# Patient Record
Sex: Male | Born: 1990 | Race: White | Hispanic: No | Marital: Married | Smoking: Never smoker
Health system: Southern US, Academic
[De-identification: ages and names within clinical notes are randomized; demographics above are authoritative.]

## PROBLEM LIST (undated history)

## (undated) DIAGNOSIS — I10 Essential (primary) hypertension: Secondary | ICD-10-CM

## (undated) HISTORY — DX: Essential (primary) hypertension: I10

## (undated) HISTORY — PX: HX TONSILLECTOMY: SHX27

## (undated) NOTE — Progress Notes (Signed)
Formatting of this note is different from the original.  Images from the original note were not included.    [address]  [place] [address]  [phone]    First Visit    Subjective     Patient ID:  Stephen Walter is a 77 y.o. (DOB [birth year]) male.     CC:     Patient presents with    Gastroesophageal Reflux       HPI: 71 year old male presents today for an initial evaluation for symptoms of GERD.  The patient says he has had GERD symptoms since he was young.  He has been on omeprazole 20 mg daily but does have occasional breakthrough.  He says this does control his GERD symptoms.  He says he had an endoscopy in 2019 and I have reviewed these records.  He was found to have gastritis and a small hiatal hernia.  He also says when he was 16 he was found to have Ulcerative colitis and he was started on medication but he only took it for a short period of time.  He says he was hospitalized at that time and was told it was either enterocolitis due to raw cookie dough he had eaten vs Ulcerative colitis. He has not had any rectal bleeding since then.  He says that he is now having 1-2 formed to soft bowel movements a day and says it is associated with a sense of complete evacuation.  No dysphagia or odynophagia. No melena or hematemesis. His father has colon cancer on his maternal side. No urgent stools.  No family history of Ulcerative colitis or Crohn's disease. He has lost about 20 pounds in the past 9 months which has been intentional.     Review of Systems:   Except as stated in the HPI, all other systems reviewed and are negative.     History reviewed. No pertinent past medical history.    There are no problems to display for this patient.    Medications:  Outpatient Medications Marked as Taking for the 09/19/22 encounter (Office Visit) with Erskine Emery, MD   Medication Sig Dispense Refill    lisinopril (PRINIVIL,ZESTRIL) 20 mg tablet Take one tablet (20 mg dose) by mouth daily.      omeprazole  (PRILOSEC) 20 mg capsule Take 1 capsule (20 mg) by mouth daily.         Not on File     Past Surgical History:   Procedure Laterality Date    Colonoscopy      unsure    Upper gastrointestinal endoscopy      unsure     Social History     Socioeconomic History    Marital status: Married   Tobacco Use    Smoking status: Never     Passive exposure: Never    Smokeless tobacco: Never   Vaping Use    Vaping Use: Never used   Substance and Sexual Activity    Drug use: Yes     History reviewed. No pertinent family history.    Objective     BP 140/90 (BP Location: Left arm, Patient Position: Sitting)   Pulse 79   Temp 97.7 F (36.5 C) (Oral)   Ht 5\' 10"  (1.778 m)   Wt 224 lb (101.6 kg)   BMI 32.14 kg/m      General Appearance:  Alert, cooperative, no distress, appears stated age   Head:  Normocephalic, without obvious abnormality, atraumatic  Eyes:  PERRL, conjunctiva/corneas clear   Nose: Nares normal, mucosa normal   Throat: Lips, mucosa, and tongue normal; teeth and gums normal   Neck: Supple, symmetrical, trachea midline, no adenopathy, thyroid without tenderness/mass/nodules   Lungs:   Clear to auscultation bilaterally, respirations unlabored, excursion symmetrical   Heart:  Regular rate and rhythm, S1, S2 normal, no murmur, rub or gallop   Abdomen:   Soft, non-tender, bowel sounds normoactive,  no masses, no organomegaly   Genitalia:  Deferred   Rectal:  Deferred   Extremities: Extremities normal, atraumatic, no cyanosis or edema, pulses 2+ symmetrically   Skin: Skin color, texture, turgor normal, no rashes or lesions   Lymph nodes: No cervical, supraclavicular, and axillary adenopathy   Neurologic: Alert, oriented x3, nonfocal exam       Assessment     1. Gastroesophageal reflux disease, unspecified whether esophagitis present    2. Colitis, acute        Plan     Orders Placed This Encounter   Procedures    C-Reactive Protein    EGD Diagnostic     No follow-ups on file.  There are no Patient Instructions on  file for this visit.  Discontinued Medications    No medications on file     Modified Medications    No medications on file     New Prescriptions    FAMOTIDINE (PEPCID) 20 MG TABLET    Take one tablet (20 mg dose) by mouth 30 (thirty) minutes before breakfast.     Discussion and Summary:  GERD-the patient has a longstanding history of GERD and is taking omeprazole 20 mg daily currently.  He would like to be able to come off of this medication however he does still feel regurgitation although he does not feel acid.  I do believe he would benefit from an endoscopy which I will schedule in the meanwhile we will try to switch him over to Pepcid 20 mg daily.    Colitis-the patient has a history of colitis and was told that he may have ulcerative colitis in the past.  He was only on treatment for short period of time and never had a recurrence of his symptoms and I suspect that likely that was related to an infectious colitis and he is not having any symptoms now so I do not see any need to repeat a colonoscopy at this point.  I will go ahead and check a CRP.    Electronically Signed:  Erskine Emery, MD  09/19/2022 9:02 AM        Electronically signed by Erskine Emery, MD at 09/19/2022  9:02 AM EDT

## (undated) NOTE — Progress Notes (Signed)
Formatting of this note might be different from the original.  EGD prep was given to the patient  Patient verbalizes and understands prep instructions  AVS was given to the patient. ERW,CMA   Electronically signed by Rory Percy, CMA at 09/19/2022  9:02 AM EDT

---
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
## 2021-05-08 ENCOUNTER — Encounter (INDEPENDENT_AMBULATORY_CARE_PROVIDER_SITE_OTHER): Payer: Self-pay | Admitting: Family Medicine

## 2021-06-06 ENCOUNTER — Ambulatory Visit (INDEPENDENT_AMBULATORY_CARE_PROVIDER_SITE_OTHER): Payer: BC Managed Care – PPO | Admitting: Family Medicine

## 2021-06-06 ENCOUNTER — Other Ambulatory Visit: Payer: Self-pay

## 2021-06-06 ENCOUNTER — Other Ambulatory Visit (INDEPENDENT_AMBULATORY_CARE_PROVIDER_SITE_OTHER): Payer: Self-pay | Admitting: Family Medicine

## 2021-06-06 ENCOUNTER — Encounter (INDEPENDENT_AMBULATORY_CARE_PROVIDER_SITE_OTHER): Payer: Self-pay | Admitting: Family Medicine

## 2021-06-06 VITALS — BP 153/92 | HR 104 | Temp 97.7°F | Resp 20 | Ht 70.0 in | Wt 221.8 lb

## 2021-06-06 DIAGNOSIS — J0100 Acute maxillary sinusitis, unspecified: Secondary | ICD-10-CM

## 2021-06-06 DIAGNOSIS — K219 Gastro-esophageal reflux disease without esophagitis: Secondary | ICD-10-CM

## 2021-06-06 DIAGNOSIS — I10 Essential (primary) hypertension: Secondary | ICD-10-CM

## 2021-06-06 DIAGNOSIS — E559 Vitamin D deficiency, unspecified: Secondary | ICD-10-CM

## 2021-06-06 DIAGNOSIS — J0101 Acute recurrent maxillary sinusitis: Secondary | ICD-10-CM

## 2021-06-06 MED ORDER — AMOXICILLIN 500 MG CAPSULE
500.0000 mg | ORAL_CAPSULE | Freq: Two times a day (BID) | ORAL | 0 refills | Status: AC
Start: 2021-06-06 — End: 2021-06-16

## 2021-06-06 MED ORDER — METHYLPREDNISOLONE ACETATE 80 MG/ML SUSPENSION FOR INJECTION
80.0000 mg | INTRAMUSCULAR | Status: AC
Start: 2021-06-06 — End: 2021-06-06
  Administered 2021-06-06: 80 mg via INTRAMUSCULAR

## 2021-06-06 MED ORDER — OMEPRAZOLE 20 MG CAPSULE,DELAYED RELEASE
20.0000 mg | DELAYED_RELEASE_CAPSULE | Freq: Every day | ORAL | 1 refills | Status: DC
Start: 2021-06-06 — End: 2021-11-08

## 2021-06-06 NOTE — Progress Notes (Signed)
Name: Stephen Walter   DOB: 01-25-1991  [31 y.o. male]   MRN: [id number]       Visit Date: 06/06/2021   Referring: Referral Self  No address on file   PCP: Daiva Huge, DO            Chief Complaint   Patient presents with   . Sinus Infection     Got back from cruise x 2 weeks ago.   Sinus pressure, headache, congestion and drainage on/off x 2 weeks. Home COVID test last week was negative.   States that his 29 year old had a sore throat 2 days after cruise all testing negative.   A dozen people had possible pneumonia, upper respiratory symptoms that had also been on cruise.           HPI:  Patient here with for complaints of sinus pressure, yellow drainage from nose, home covid neg.      Respiratory:   Symptoms: congestion, facial pain, headache described as frontal/maxillary pressure, nasal congestion, non productive cough and sore throat  Duration: 2 weeks days          Review of Systems   Constitutional: Negative.  Negative for chills, fever and weight loss.   HENT: Positive for congestion and sinus pain. Negative for ear pain, hearing loss and sore throat.    Eyes: Negative.  Negative for blurred vision, pain and redness.   Respiratory: Negative.  Negative for cough, shortness of breath and wheezing.    Cardiovascular: Negative.  Negative for chest pain and palpitations.   Gastrointestinal: Negative.  Negative for abdominal pain, blood in stool, constipation, diarrhea, heartburn, nausea and vomiting.   Genitourinary: Negative.  Negative for flank pain, frequency, hematuria and urgency.   Musculoskeletal: Negative.  Negative for back pain, joint pain and neck pain.   Skin: Negative.  Negative for itching and rash.   Neurological: Negative.  Negative for speech change, seizures, weakness and headaches.   Endo/Heme/Allergies: Negative.    Psychiatric/Behavioral: Negative.           Physical Exam  Constitutional:       Appearance: Normal appearance.   HENT:      Head: Normocephalic.      Right Ear: External  ear normal.      Left Ear: External ear normal.   Eyes:      Conjunctiva/sclera: Conjunctivae normal.      Pupils: Pupils are equal, round, and reactive to light.   Cardiovascular:      Rate and Rhythm: Normal rate.      Pulses: Normal pulses.   Pulmonary:      Effort: Pulmonary effort is normal. No respiratory distress.      Breath sounds: Normal breath sounds. No wheezing or rales.   Musculoskeletal:         General: Normal range of motion.   Skin:     General: Skin is warm.   Neurological:      General: No focal deficit present.      Mental Status: He is alert. Mental status is at baseline.   Psychiatric:         Mood and Affect: Mood normal.         Thought Content: Thought content normal.         Judgment: Judgment normal.            Problem List Items Addressed This Visit    None  Visit Diagnoses     Acute  maxillary sinusitis    -  Primary             Plan    Depo medrol given in office  Amoxicillin prescribed  Advised increase fluid intake, getting adequate rest..  Discussed proper hygiene to assist in decrease to spread of illness.    Return to clinic if symptoms persist beyond treatment or change/worsen.

## 2021-06-18 ENCOUNTER — Other Ambulatory Visit (INDEPENDENT_AMBULATORY_CARE_PROVIDER_SITE_OTHER): Payer: Self-pay | Admitting: Family Medicine

## 2021-06-18 MED ORDER — LISINOPRIL 20 MG TABLET
20.0000 mg | ORAL_TABLET | Freq: Every day | ORAL | 1 refills | Status: DC
Start: 2021-06-18 — End: 2021-11-08

## 2021-07-11 ENCOUNTER — Encounter (INDEPENDENT_AMBULATORY_CARE_PROVIDER_SITE_OTHER): Payer: BC Managed Care – PPO | Admitting: Family Medicine

## 2021-07-12 ENCOUNTER — Encounter (INDEPENDENT_AMBULATORY_CARE_PROVIDER_SITE_OTHER): Payer: BC Managed Care – PPO | Admitting: Family Medicine

## 2021-08-14 ENCOUNTER — Other Ambulatory Visit: Payer: Self-pay

## 2021-08-14 ENCOUNTER — Encounter (INDEPENDENT_AMBULATORY_CARE_PROVIDER_SITE_OTHER): Payer: Self-pay | Admitting: Family Medicine

## 2021-08-14 ENCOUNTER — Ambulatory Visit (INDEPENDENT_AMBULATORY_CARE_PROVIDER_SITE_OTHER): Payer: BC Managed Care – PPO | Admitting: Family Medicine

## 2021-08-14 VITALS — BP 137/91 | HR 75 | Temp 98.1°F | Resp 18 | Ht 70.0 in | Wt 220.6 lb

## 2021-08-14 DIAGNOSIS — Z0001 Encounter for general adult medical examination with abnormal findings: Secondary | ICD-10-CM

## 2021-08-14 DIAGNOSIS — Z0000 Encounter for general adult medical examination without abnormal findings: Secondary | ICD-10-CM

## 2021-08-14 DIAGNOSIS — J019 Acute sinusitis, unspecified: Secondary | ICD-10-CM

## 2021-08-14 DIAGNOSIS — I10 Essential (primary) hypertension: Secondary | ICD-10-CM

## 2021-08-14 DIAGNOSIS — E559 Vitamin D deficiency, unspecified: Secondary | ICD-10-CM

## 2021-08-14 MED ORDER — AZITHROMYCIN 250 MG TABLET
ORAL_TABLET | ORAL | 0 refills | Status: DC
Start: 2021-08-14 — End: 2023-10-07

## 2021-08-14 MED ORDER — METHYLPREDNISOLONE ACETATE 80 MG/ML SUSPENSION FOR INJECTION
80.0000 mg | INTRAMUSCULAR | Status: AC
Start: 2021-08-14 — End: 2021-08-14
  Administered 2021-08-14: 80 mg via INTRAMUSCULAR

## 2021-08-14 NOTE — Nursing Note (Signed)
08/14/21 1548   Depression Screen   Little interest or pleasure in doing things. 0   Feeling down, depressed, or hopeless 0   PHQ 2 Total 0

## 2021-08-14 NOTE — Progress Notes (Signed)
[facility]  FAMILY MEDICINE, MEDICAL OFFICE BUILDING  [address]  [place] [address]  [phone]   Stephen Walter  07-30-1990  [id number]    Date of Service: 08/22/2021    Chief complaint:   Chief Complaint   Patient presents with   . Physical       Subjective:      Stephen Walter is a 31 y.o. male and is here for a comprehensive physical exam.  The patient reports congestion, sinus pain, cough for days, aside from this doing well.    Past Medical History:  Past Medical History:   Diagnosis Date   . Hypertension          Past Surgical History:   Procedure Laterality Date   . HX TONSILLECTOMY            Family Medical History:     Problem Relation (Age of Onset)    Cancer Other    Hypertension (High Blood Pressure) Other                Medications:  Current Outpatient Medications   Medication Sig Dispense Refill   . azithromycin (ZITHROMAX) 250 mg Oral Tablet Take 500 mg (2 tab) on day 1; take 250 mg (1 tab) on days 2-5. 6 Tablet 0   . ergocalciferol, vitamin D2, (DRISDOL) 1,250 mcg (50,000 unit) Oral Capsule Take 1 Capsule (50,000 Units total) by mouth Every 7 days     . lisinopriL (PRINIVIL) 20 mg Oral Tablet Take 1 Tablet (20 mg total) by mouth Once a day 90 Tablet 1   . omeprazole (PRILOSEC) 20 mg Oral Capsule, Delayed Release(E.C.) Take 1 Capsule (20 mg total) by mouth Once a day 90 Capsule 1     No current facility-administered medications for this visit.     Do you take any herbs or supplements that were not prescribed by a doctor? no  Are you taking calcium supplements? no  Are you taking aspirin daily?no    No Known Allergies    Social History     Socioeconomic History   . Marital status: Unknown     Spouse name: Not on file   . Number of children: Not on file   . Years of education: Not on file   . Highest education level: Not on file   Occupational History   . Not on file   Tobacco Use   . Smoking status: Never   . Smokeless tobacco: Never   Substance and Sexual  Activity   . Alcohol use: Not Currently   . Drug use: Never   . Sexual activity: Not on file   Other Topics Concern   . Not on file   Social History Narrative   . Not on file     Social Determinants of Health     Financial Resource Strain: Not on file   Transportation Needs: Not on file   Social Connections: Not on file   Intimate Partner Violence: Not on file   Housing Stability: Not on file       Review of Systems    Review of Systems   Constitutional: Negative.  Negative for chills, fever and weight loss.   HENT: Negative.  Negative for congestion, ear pain, hearing loss, sinus pain and sore throat.    Eyes: Negative.  Negative for blurred vision, pain and redness.   Respiratory: Negative.  Negative for cough, shortness of breath and wheezing.  Cardiovascular: Negative.  Negative for chest pain and palpitations.   Gastrointestinal: Negative.  Negative for abdominal pain, blood in stool, constipation, diarrhea, heartburn, nausea and vomiting.   Genitourinary: Negative.  Negative for flank pain, frequency, hematuria and urgency.   Musculoskeletal: Negative.  Negative for back pain, joint pain and neck pain.   Skin: Negative.  Negative for itching and rash.   Neurological: Negative.  Negative for speech change, seizures, weakness and headaches.   Endo/Heme/Allergies: Negative.    Psychiatric/Behavioral: Negative.           Objective:     BP (!) 137/91   Pulse 75   Temp 36.7 C (98.1 F)   Resp 18   Ht 1.778 m (5\' 10" )   Wt 100 kg (220 lb 9.6 oz)   SpO2 98%   BMI 31.65 kg/m         General:  alert, cooperative, no distress, appears stated age   Skin:  Normal.   Eyes: conjunctivae/corneas clear. PERRL, EOM's intact. Fundi benign   Mouth: MMM no lesions   Lymph Nodes:  Cervical, supraclavicular, and axillary nodes normal.   Lungs:  clear to auscultation bilaterally   Heart:  regular rate and rhythm, S1, S2 normal, no murmur, click, rub or gallop   Abdomen: soft, non-tender. Bowel sounds normal. No masses,  no  organomegaly               Extremities:  extremities normal, atraumatic, no cyanosis or edema   Neurologic:  AOx3. Gait normal. Reflexes and motor strength normal and symmetric. Cranial nerves 2-12 and sensation grossly intact.   Psychiatric:  non focal   Laboratory:     Assessment:       ICD-10-CM    1. Vitamin D deficiency  E55.9 LIPID PANEL     VITAMIN D 25 TOTAL     GLUCOSE, NON FASTING      2. Hypertension, unspecified type  I10 LIPID PANEL     VITAMIN D 25 TOTAL     GLUCOSE, NON FASTING      3. Well adult exam  Z00.00 LIPID PANEL     VITAMIN D 25 TOTAL     GLUCOSE, NON FASTING          Plan:     PLAN:  Counseling with regards to preventative care given  Patient given Advanced Directive if applicable? not applicable  Immunizations reviewed         Patient Counseling:  --Nutrition: advise patient to eat a diet that is primarily lean protein and produce (fruits/vegetables), drink primarily water and low/no calorie drinks, moderation on caffeine/sugary/greasy foods  --Exercise: encourage 150 mins of exercise per week  --Substance Abuse: Discussed cessation/primary prevention of tobacco, alcohol, or other drug use; driving or other dangerous activities under the influence; availability of treatment for abuse.    --Sexuality: Discussed sexually transmitted diseases, partner selection, use of condoms, avoidance of unintended pregnancy  and contraceptive alternatives.   --Injury prevention: Discussed safety belts, safety helmets, smoke detector, smoking near bedding or upholstery.   --Dental health: Discussed importance of regular tooth brushing, flossing, and dental visits.        Plan:  Orders Placed This Encounter   . LIPID PANEL   . VITAMIN D 25 TOTAL   . GLUCOSE, NON FASTING   . azithromycin (ZITHROMAX) 250 mg Oral Tablet   . methylPREDNISolone acetate (DEPO-MEDROL) 80 mg/mL injection     zpack and depo medrol for sinusitis   labs today  will sign form for insurance once these return              Return in about 1  year (around 08/15/2022) for well adult.    Cleda Clarks, DO 08/22/2021, 08:13

## 2021-08-22 ENCOUNTER — Encounter (INDEPENDENT_AMBULATORY_CARE_PROVIDER_SITE_OTHER): Payer: Self-pay | Admitting: Family Medicine

## 2021-08-23 ENCOUNTER — Other Ambulatory Visit: Payer: Self-pay

## 2021-08-23 ENCOUNTER — Other Ambulatory Visit: Payer: BC Managed Care – PPO | Attending: Family Medicine | Admitting: Family Medicine

## 2021-08-23 ENCOUNTER — Ambulatory Visit (INDEPENDENT_AMBULATORY_CARE_PROVIDER_SITE_OTHER): Payer: BC Managed Care – PPO

## 2021-08-23 VITALS — BP 126/76 | HR 82

## 2021-08-23 DIAGNOSIS — I10 Essential (primary) hypertension: Secondary | ICD-10-CM

## 2021-08-23 DIAGNOSIS — E559 Vitamin D deficiency, unspecified: Secondary | ICD-10-CM | POA: Insufficient documentation

## 2021-08-23 DIAGNOSIS — Z0000 Encounter for general adult medical examination without abnormal findings: Secondary | ICD-10-CM

## 2021-08-23 LAB — LIPID PANEL
CHOL/HDL RATIO: 3.1
CHOLESTEROL: 171 mg/dL (ref <200)
HDL CHOL: 55 mg/dL (ref 23–92)
LDL CALC: 62 mg/dL (ref 0–100)
TRIGLYCERIDES: 271 mg/dL — ABNORMAL HIGH (ref <=150)
VLDL CALC: 54 mg/dL — ABNORMAL HIGH (ref 0–50)

## 2021-08-23 LAB — GLUCOSE, NON FASTING: GLUCOSE: 89 mg/dL (ref 74–109)

## 2021-08-23 LAB — VITAMIN D 25 TOTAL: VITAMIN D: 25 ng/mL — ABNORMAL LOW (ref 30–100)

## 2021-08-23 NOTE — Progress Notes (Signed)
Patient here for BP recheck. Today it is 126/76.

## 2021-11-01 ENCOUNTER — Other Ambulatory Visit (INDEPENDENT_AMBULATORY_CARE_PROVIDER_SITE_OTHER): Payer: Self-pay | Admitting: Family Medicine

## 2021-11-08 ENCOUNTER — Other Ambulatory Visit (INDEPENDENT_AMBULATORY_CARE_PROVIDER_SITE_OTHER): Payer: Self-pay | Admitting: Family Medicine

## 2022-10-17 ENCOUNTER — Other Ambulatory Visit (HOSPITAL_COMMUNITY): Payer: Self-pay | Admitting: Family Medicine

## 2022-10-17 ENCOUNTER — Other Ambulatory Visit: Payer: Self-pay

## 2022-10-17 ENCOUNTER — Inpatient Hospital Stay
Admission: RE | Admit: 2022-10-17 | Discharge: 2022-10-17 | Disposition: A | Discharge status: Home / Self Care | Payer: BC Managed Care – PPO | Source: Ambulatory Visit | Attending: Family Medicine | Admitting: Family Medicine

## 2022-10-17 DIAGNOSIS — R059 Cough, unspecified: Secondary | ICD-10-CM

## 2022-11-22 ENCOUNTER — Ambulatory Visit
Admission: RE | Admit: 2022-11-22 | Discharge: 2022-11-22 | Disposition: A | Discharge status: Home / Self Care | Payer: BC Managed Care – PPO | Source: Ambulatory Visit | Attending: Family Medicine | Admitting: Family Medicine

## 2022-11-22 ENCOUNTER — Other Ambulatory Visit (HOSPITAL_COMMUNITY): Payer: Self-pay | Admitting: Family Medicine

## 2022-11-22 ENCOUNTER — Other Ambulatory Visit: Payer: Self-pay

## 2022-11-22 DIAGNOSIS — M25531 Pain in right wrist: Secondary | ICD-10-CM

## 2023-01-22 ENCOUNTER — Other Ambulatory Visit (HOSPITAL_COMMUNITY): Payer: Self-pay | Admitting: Family Medicine

## 2023-01-22 DIAGNOSIS — M25531 Pain in right wrist: Secondary | ICD-10-CM

## 2023-01-23 ENCOUNTER — Encounter (HOSPITAL_COMMUNITY): Payer: Self-pay

## 2023-01-31 IMAGING — MR MRI WRIST RT W/O CONTRAST
5 of 7 series · 29 of 40 positions shown · IV contrast (gadolinium)
Comparison: None available.

﻿EXAM:  46446   MRI WRIST RT W/O CONTRAST
INDICATION: Right wrist pain on the ulnar aspect.  Sustained injury due to fall.  No history of right wrist surgery.
TECHNIQUE: Multiplanar, multisequential MRI of the right wrist was performed without gadolinium contrast.

[Series 6: T1 · axial · right · 3.0mm · 0.26mm/px · z∈[-70,-16]mm · 7 of 20 slices shown (1 of 2)]
[im 1/20]
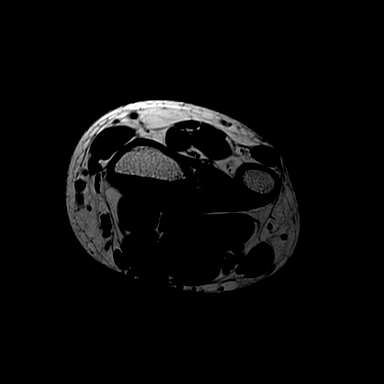
[im 4/20]
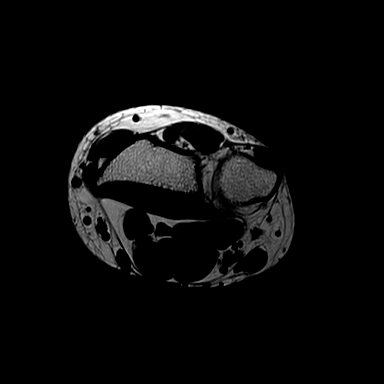
[im 7/20]
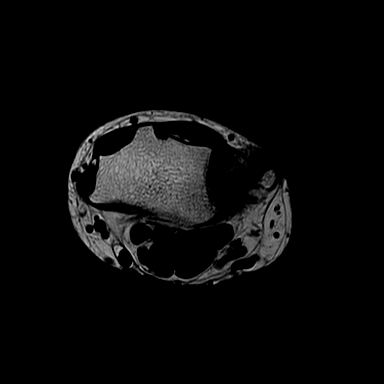
[im 10/20]
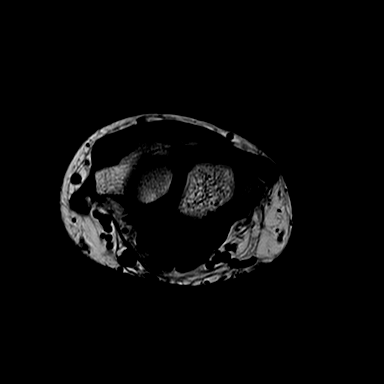
[im 13/20]
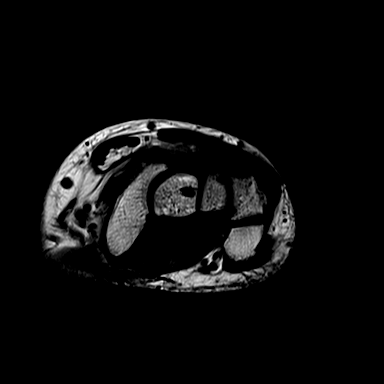
[im 16/20]
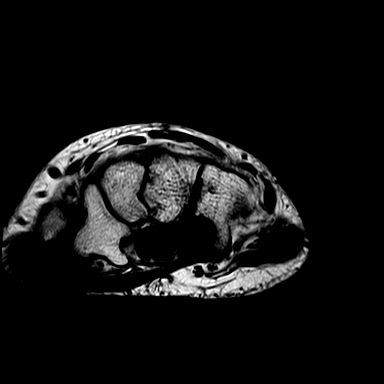
[im 20/20]
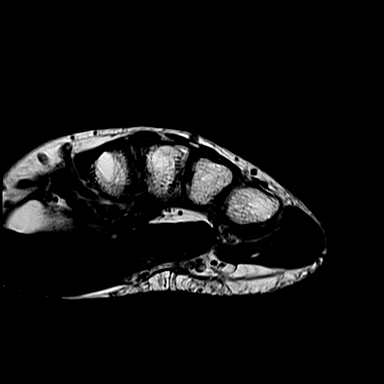

[Series 7: PD fat-sat · axial · right · 3.0mm · 0.22mm/px · z∈[-70,-16]mm · 7 of 20 slices shown]
[im 1/20]
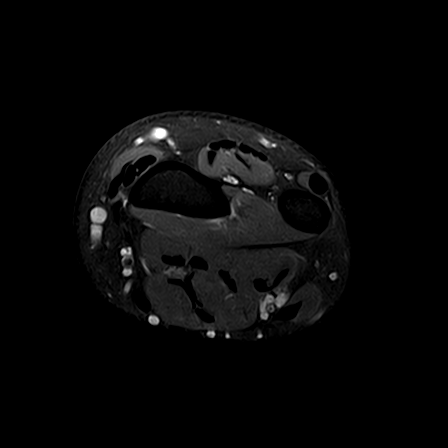
[im 4/20]
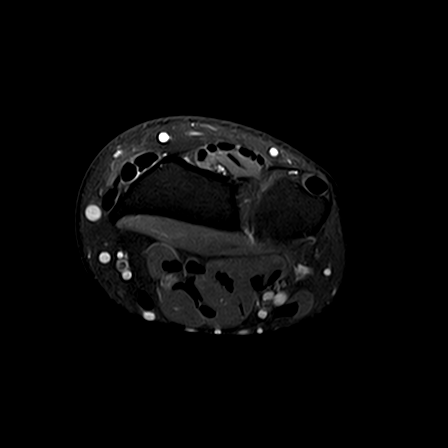
[im 7/20]
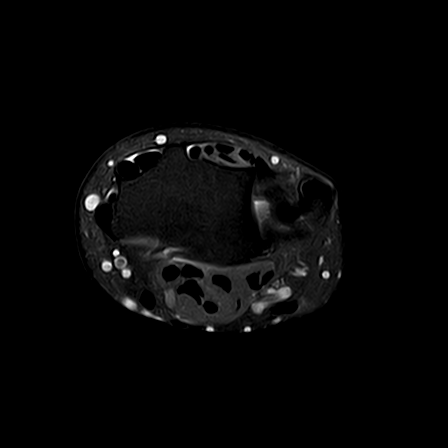
[im 10/20]
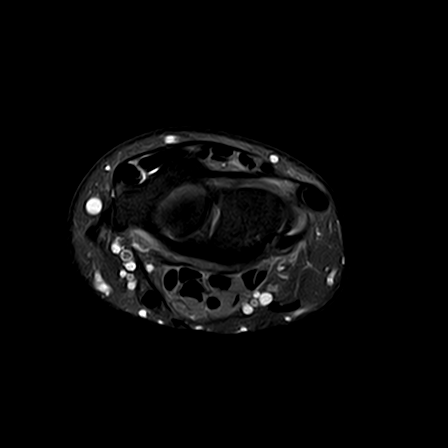
[im 13/20]
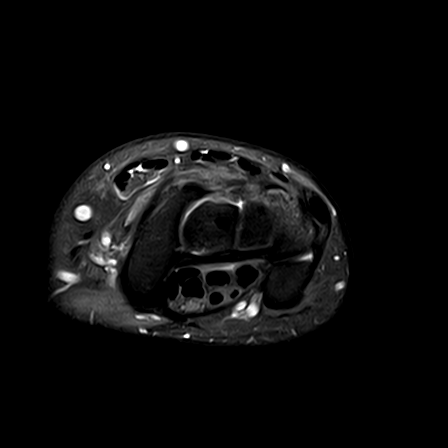
[im 16/20]
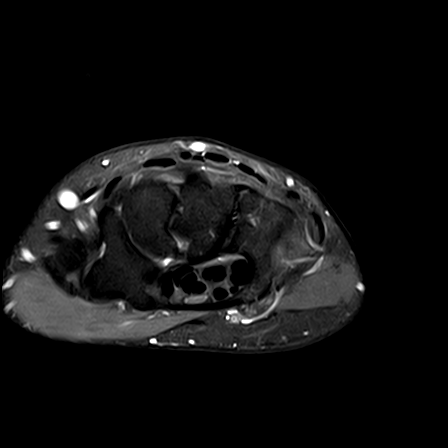
[im 20/20]
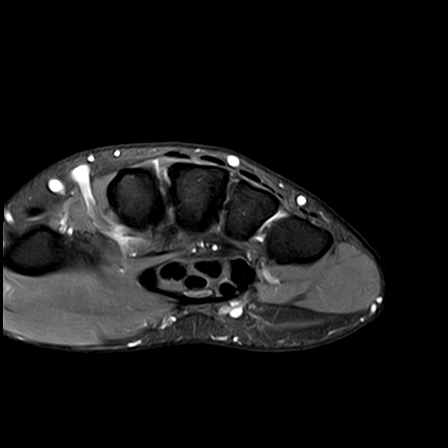

[Series 8: T1 · sagittal · right · 3.5mm · 0.22mm/px · 3 of 20 slices shown (2 of 2)]
[im 1/20]
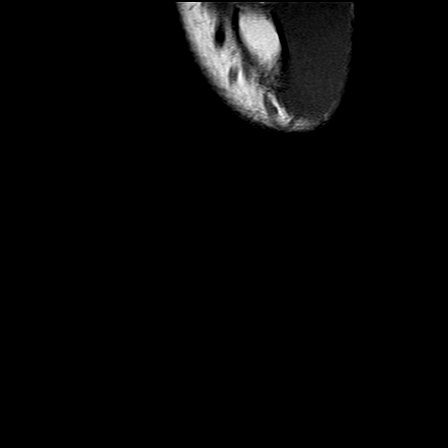
[im 4/20]
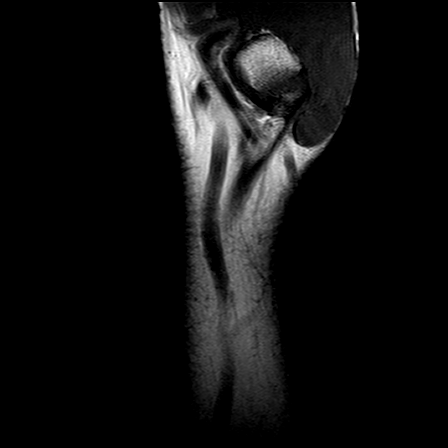
[im 7/20]
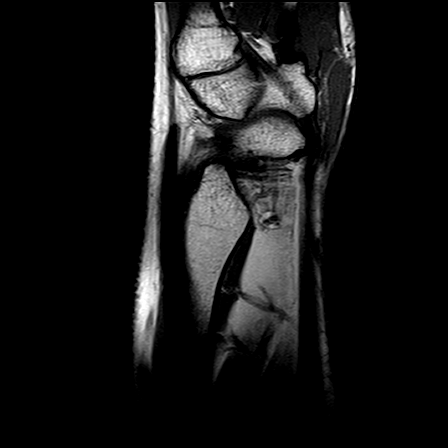

[Series 9: T2 fat-sat · sagittal · right · 3.5mm · 0.22mm/px · 7 of 20 slices shown]
[im 1/20]
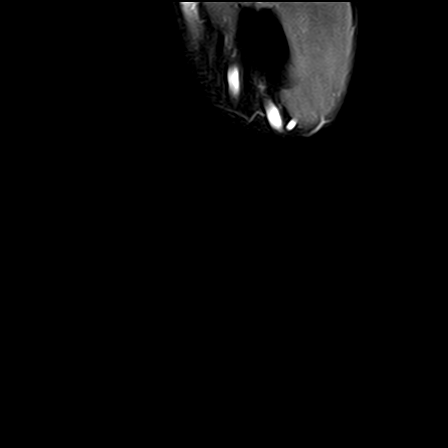
[im 4/20]
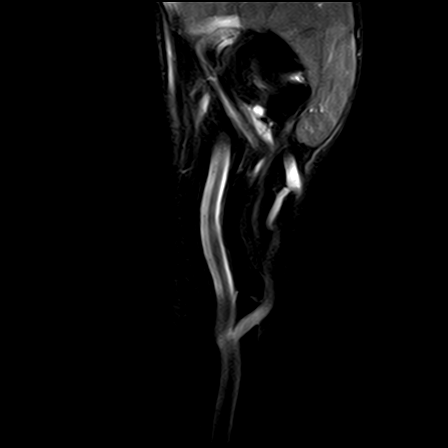
[im 7/20]
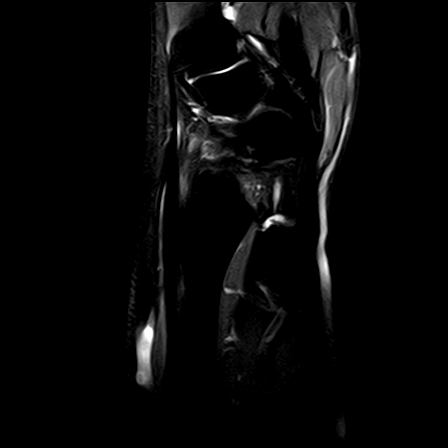
[im 10/20]
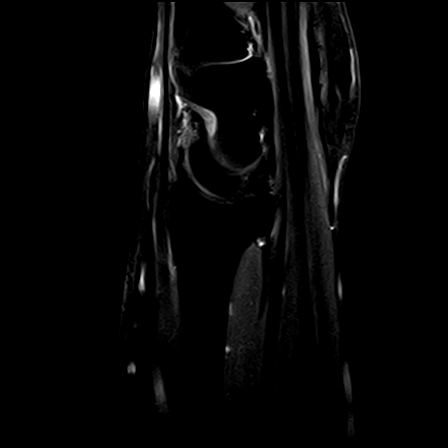
[im 13/20]
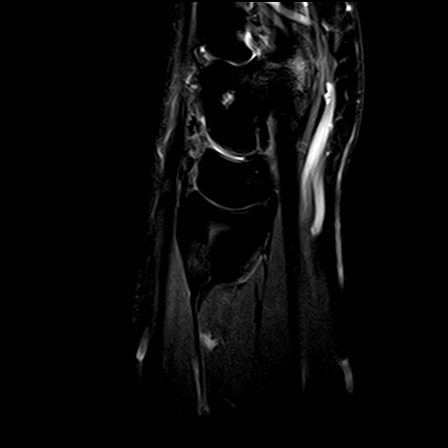
[im 16/20]
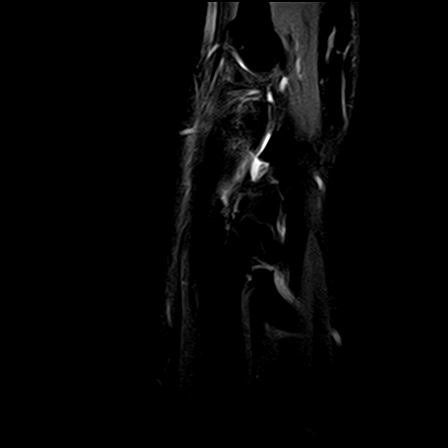
[im 20/20]
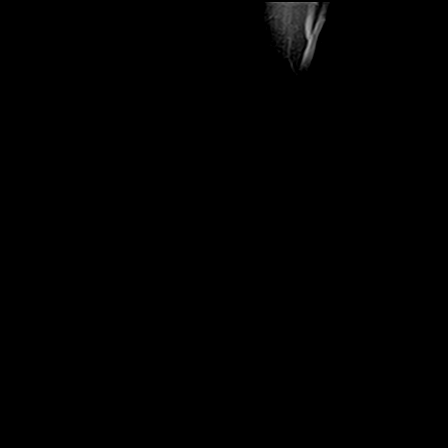

[Series 13: PD · coronal · right · 3.0mm · 0.29mm/px · 5 of 14 slices shown]
[im 1/14]
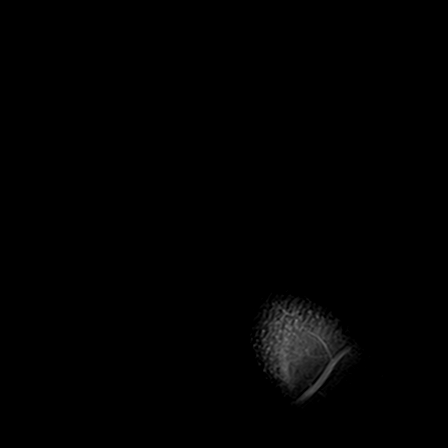
[im 4/14]
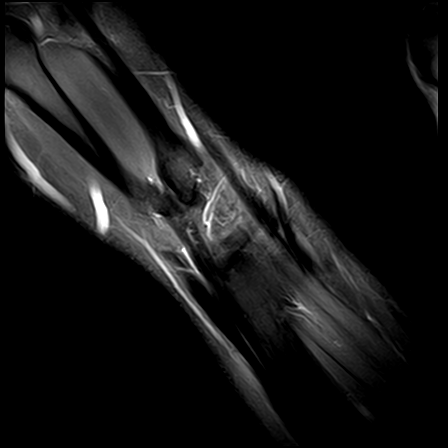
[im 7/14]
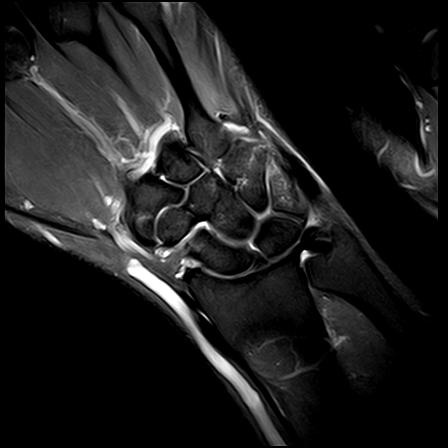
[im 10/14]
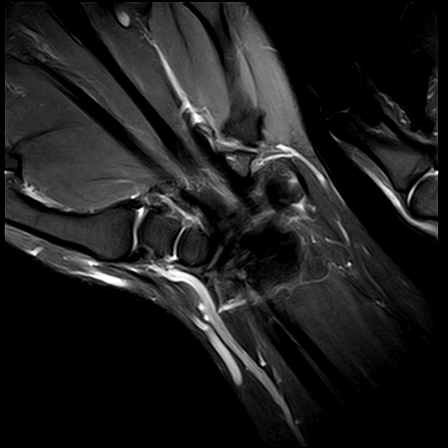
[im 14/14]
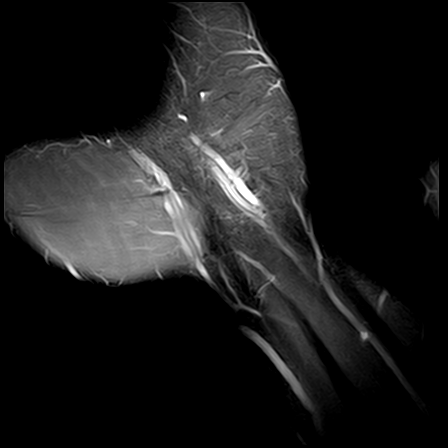

[29 of 40 positions shown; findings below may reference images not displayed]

FINDINGS: No acute or focal bone changes are seen at the right wrist.  Distal radioulnar ligaments are intact. 

Intercarpal ligaments are intact.  Triangular fibrocartilage is intact.

Structures of carpal tunnel are intact. Extensor tendons are unremarkable.  Soft tissue structures are unremarkable.
IMPRESSION: No significant acute or focal bone changes at the wrist.  Intercarpal ligaments and triangular fibrocartilage are intact.  No definite focal abnormalities of the tendons of the wrist are seen.

## 2023-10-06 ENCOUNTER — Other Ambulatory Visit: Payer: Self-pay

## 2023-10-07 ENCOUNTER — Ambulatory Visit: Payer: Self-pay | Attending: Family Medicine | Admitting: OTOLARYNGOLOGY

## 2023-10-07 ENCOUNTER — Encounter (INDEPENDENT_AMBULATORY_CARE_PROVIDER_SITE_OTHER): Payer: Self-pay | Admitting: OTOLARYNGOLOGY

## 2023-10-07 VITALS — Ht 70.0 in | Wt 220.0 lb

## 2023-10-07 DIAGNOSIS — J309 Allergic rhinitis, unspecified: Secondary | ICD-10-CM

## 2023-10-07 DIAGNOSIS — H6991 Unspecified Eustachian tube disorder, right ear: Secondary | ICD-10-CM | POA: Insufficient documentation

## 2023-10-07 DIAGNOSIS — H9201 Otalgia, right ear: Secondary | ICD-10-CM | POA: Insufficient documentation

## 2023-10-07 DIAGNOSIS — J342 Deviated nasal septum: Secondary | ICD-10-CM

## 2023-10-07 DIAGNOSIS — K219 Gastro-esophageal reflux disease without esophagitis: Secondary | ICD-10-CM | POA: Insufficient documentation

## 2023-10-07 NOTE — H&P (Signed)
 [facility]  [address]  [place] [address]  Operated by [facility]  History and Physical    Name: Stephen Walter MRN:  [id number]   Date: 10/07/2023 DOB:  1990/02/17 (33 y.o.)            New Patient      Chief Complaint:    Chief Complaint   Patient presents with    Ear Problem(s)     States having pain in right jaw and ear pain for the past 2 months. Also states having popping in both ears and fullness in right ear.       HPI:  Stephen Walter is a 33 y.o. male presenting for a new patient visit. Patient states that he has been having right sided headaches, otalgia and facial pain.  Patient denies any otorrhea or fever/chills..    Tymps Type A Au    Past Medical History:   Diagnosis Date    Hypertension          Past Surgical History:   Procedure Laterality Date    HX TONSILLECTOMY           Family Medical History:       Problem Relation (Age of Onset)    Cancer Other    Hypertension (High Blood Pressure) Other            Social History[1]     Medications:  Current Outpatient Medications   Medication Sig    cyclobenzaprine (FLEXERIL) 5 mg Oral Tablet TAKE 1 TABLET BY MOUTH IN THE MORNING AND 1 TABLET BY MOUTH BEFORE BEDTIME    ergocalciferol , vitamin D2, (DRISDOL) 1,250 mcg (50,000 unit) Oral Capsule Take 1 capsule by mouth weekly.    fluticasone propionate (FLONASE ALLERGY RELIEF) 50 mcg/actuation Nasal Spray, Suspension     lisinopriL  (PRINIVIL ) 20 mg Oral Tablet Take 1 tablet by mouth daily.    omeprazole  (PRILOSEC) 20 mg Oral Capsule, Delayed Release(E.C.) Take 1 capsule (20 mg) by mouth daily.       Allergies:  Allergies[2]    Review of Systems:  Review of Systems     Physical Exam:  Vitals:    10/07/23 1448   Weight: 99.8 kg (220 lb)   Height: 1.778 m (5' 10)   BMI: 31.57      ENT Physical Exam  Constitutional  Appearance: patient appears well-developed, well-nourished and well-groomed,  Communication/Voice: communication appropriate for developmental age; vocal quality  normal;  Head and Face  Appearance: head appears normal, face appears normal and face appears atraumatic;  Palpation: facial palpation normal;  Salivary: glands normal;  Ear  Hearing: intact;  Auricles: right auricle normal; left auricle normal;  External Mastoids: right external mastoid normal; left external mastoid normal;  Ear Canals: right ear canal normal; left ear canal normal;  Tympanic Membranes: right tympanic membrane normal; left tympanic membrane normal;  Nose  External Nose: nares patent bilaterally; external nose normal;  Internal Nose: nasal mucosa normal; septum normal; bilateral inferior turbinates normal;  Oral Cavity/Oropharynx  Lips: normal;  Teeth: normal;  Gums: gingiva normal;  Tongue: normal;  Oral mucosa: normal;  Hard palate: normal;  Soft palate: normal;  Tonsils: normal;  Base of Tongue: normal;  Posterior pharyngeal wall: normal;  Neck  Neck: neck normal; neck palpation normal;  Thyroid: thyroid normal;  Respiratory  Inspection: breathing unlabored; normal breathing rate;  Lymphatic  Palpation: lymph nodes normal;  Neurovestibular  Mental Status: alert and oriented;  Psychiatric: mood normal;  affect is appropriate;  Cranial Nerves: cranial nerves intact;       Assessment and Plan:    ICD-10-CM    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 Otalgia of right ear  H92.01 POCT HEARING/VISION/TYMPANOGRAM (AMB ONLY)     31231 - NASAL ENDOSCOPY DIAGNOSTIC UNILATERAL OR BILATERAL (AMB ONLY)     [facility]      3. ETD (Eustachian tube dysfunction), right  H69.91         Orders Placed This Encounter    31231 - NASAL ENDOSCOPY DIAGNOSTIC UNILATERAL OR BILATERAL (AMB ONLY)    [facility]    POCT HEARING/VISION/TYMPANOGRAM (AMB ONLY)    Will check audiogram  Will continue Flonase.     Follow Up:  Return for Follow up after audiogram.     Oneil Arch, DO         [1]   Social History  Tobacco Use    Smoking status: Never     Smokeless tobacco: Never   Substance Use Topics    Alcohol use: Not Currently    Drug use: Never   [2] No Known Allergies

## 2023-10-07 NOTE — Procedures (Signed)
 [facility]  [address]  [place] [address]  Operated by [facility]  Procedure Note    Name: [name] MRN:  [id number]   Date: 10/07/2023 DOB:  [birth year] (33 y.o.)         31231 - NASAL ENDOSCOPY DIAGNOSTIC UNILATERAL OR BILATERAL (AMB ONLY)    Performed by: Margean Anes, DO  Authorized by: Margean Anes, DO    Time Out:     Immediately before the procedure, a time out was called:  Yes    Patient verified:  Yes    Procedure Verified:  Yes    Site Verified:  Yes  Documentation:      [facility]  [address]  [place] [address]  Operated by [facility]  Procedure Note    Name: [name] MRN:  [id number]  Date: 10/07/2023 DOB:  10/01/1990 (33 y.o.)        @PROCDOC @    Indications for procedure: Otalgia    Anesthesia: Oxymetazoline nasal spray    Description: Nasal endoscopy with rigid scope was performed with examination of the  septum, inferior, middle, and superior meatus, turbinates, sphenoethmoidal recess, and nasopharynx.     There were no polyps, pus, or granulation tissue noted.  ET orifices and nasopharynx were normal.     Findings: Septal deviation and Allergic rhinitis    The patient tolerated the procedure well.    Anes Margean, DO             Cassondra Stachowski Jessie, DO

## 2023-10-08 ENCOUNTER — Encounter (INDEPENDENT_AMBULATORY_CARE_PROVIDER_SITE_OTHER): Payer: Self-pay | Admitting: OTOLARYNGOLOGY

## 2023-10-13 ENCOUNTER — Ambulatory Visit (INDEPENDENT_AMBULATORY_CARE_PROVIDER_SITE_OTHER): Payer: Self-pay | Admitting: OTOLARYNGOLOGY

## 2023-10-14 ENCOUNTER — Ambulatory Visit (INDEPENDENT_AMBULATORY_CARE_PROVIDER_SITE_OTHER): Payer: Self-pay | Admitting: OTOLARYNGOLOGY

## 2023-12-18 ENCOUNTER — Ambulatory Visit (INDEPENDENT_AMBULATORY_CARE_PROVIDER_SITE_OTHER): Payer: Self-pay | Admitting: OTOLARYNGOLOGY

## 2023-12-31 ENCOUNTER — Other Ambulatory Visit (HOSPITAL_COMMUNITY): Payer: Self-pay | Admitting: Family Medicine

## 2023-12-31 DIAGNOSIS — R002 Palpitations: Secondary | ICD-10-CM

## 2024-01-05 ENCOUNTER — Other Ambulatory Visit: Payer: Self-pay

## 2024-01-05 ENCOUNTER — Ambulatory Visit
Admission: RE | Admit: 2024-01-05 | Discharge: 2024-01-05 | Disposition: A | Source: Ambulatory Visit | Attending: Family Medicine

## 2024-01-05 DIAGNOSIS — R002 Palpitations: Secondary | ICD-10-CM

## 2024-01-14 LAB — 3 DAY EXTENDED HOLTER MONITOR
Enrollment Period End: 20251204074002
Enrollment Period Start: 20251201073526
Heart rate (average): 83 {beats}/min
Isolated VE Counts: 9231 episodes
Longest supraventricular tachycardia episode - duration: 3.1 s
Longest supraventricular tachycardia episode - heart rate (: 105 {beats}/min
Longest supraventricular tachycardia episode - number of be: 5 beats
Longest trigeminy - duration: 7 s
Supraventricular tachycardia - heart rate (average): 105 {beats}/min
Supraventricular tachycardia - number of episodes: 1
Supraventricular tachycardia with fastest heart rate - dura: 3.1 s
Supraventricular tachycardia with fastest heart rate - hear: 105 {beats}/min
Supraventricular tachycardia with fastest heart rate - numb: 5 beats
VE Couplets Counts: 1 episodes
Ventricular tachycardia - heart rate (average): -1 {beats}/min
